# Patient Record
Sex: Male | Born: 1955 | Race: White | Hispanic: No | Marital: Married | Smoking: Current every day smoker
Health system: Southern US, Community
[De-identification: ages and names within clinical notes are randomized; demographics above are authoritative.]

## PROBLEM LIST (undated history)

## (undated) HISTORY — PX: SHOULDER SURGERY: SHX246

## (undated) HISTORY — PX: WRIST SURGERY: SHX841

## (undated) HISTORY — PX: KNEE SURGERY: SHX244

## (undated) HISTORY — PX: BACK SURGERY: SHX140

---

## 1998-08-23 ENCOUNTER — Encounter: Payer: Self-pay | Admitting: Orthopedic Surgery

## 1998-08-23 ENCOUNTER — Ambulatory Visit (HOSPITAL_COMMUNITY): Admission: RE | Admit: 1998-08-23 | Discharge: 1998-08-23 | Payer: Self-pay | Admitting: Orthopedic Surgery

## 1999-02-08 ENCOUNTER — Ambulatory Visit (HOSPITAL_BASED_OUTPATIENT_CLINIC_OR_DEPARTMENT_OTHER): Admission: RE | Admit: 1999-02-08 | Discharge: 1999-02-08 | Payer: Self-pay | Admitting: Orthopedic Surgery

## 1999-10-28 ENCOUNTER — Ambulatory Visit (HOSPITAL_BASED_OUTPATIENT_CLINIC_OR_DEPARTMENT_OTHER): Admission: RE | Admit: 1999-10-28 | Discharge: 1999-10-28 | Payer: Self-pay | Admitting: Orthopedic Surgery

## 2008-10-22 ENCOUNTER — Emergency Department (HOSPITAL_COMMUNITY): Admission: EM | Admit: 2008-10-22 | Discharge: 2008-10-22 | Payer: Self-pay | Admitting: Emergency Medicine

## 2008-11-24 ENCOUNTER — Encounter: Admission: RE | Admit: 2008-11-24 | Discharge: 2008-11-24 | Payer: Self-pay | Admitting: Cardiovascular Disease

## 2008-12-09 ENCOUNTER — Ambulatory Visit (HOSPITAL_COMMUNITY): Admission: RE | Admit: 2008-12-09 | Discharge: 2008-12-09 | Payer: Self-pay | Admitting: Cardiology

## 2010-06-24 LAB — BASIC METABOLIC PANEL
BUN: 12 mg/dL (ref 6–23)
CO2: 27 mEq/L (ref 19–32)
Calcium: 9 mg/dL (ref 8.4–10.5)
Chloride: 105 mEq/L (ref 96–112)
Creatinine, Ser: 1.3 mg/dL (ref 0.4–1.5)
GFR calc Af Amer: >60 mL/min (ref >60)
GFR calc non Af Amer: 57 mL/min — ABNORMAL LOW (ref >60)
Glucose, Bld: 112 mg/dL — ABNORMAL HIGH (ref 70–99)
Potassium: 3.9 mEq/L (ref 3.5–5.1)
Sodium: 138 mEq/L (ref 135–145)

## 2010-06-24 LAB — CBC
HCT: 40.7 % (ref 39.0–52.0)
Hemoglobin: 14.2 g/dL (ref 13.0–17.0)
MCHC: 34.8 g/dL (ref 30.0–36.0)
MCV: 100.2 fL — ABNORMAL HIGH (ref 78.0–100.0)
Platelets: 296 10*3/uL (ref 150–400)
RBC: 4.06 MIL/uL — ABNORMAL LOW (ref 4.22–5.81)
RDW: 14.2 % (ref 11.5–15.5)
WBC: 14.7 10*3/uL — ABNORMAL HIGH (ref 4.0–10.5)

## 2010-06-24 LAB — PROTIME-INR: Prothrombin Time: 19.3 seconds — ABNORMAL HIGH (ref 11.6–15.2)

## 2010-06-26 LAB — CBC
HCT: 40.8 % (ref 39.0–52.0)
Hemoglobin: 14.3 g/dL (ref 13.0–17.0)
MCHC: 35.2 g/dL (ref 30.0–36.0)
MCV: 98.5 fL (ref 78.0–100.0)
Platelets: 278 K/uL (ref 150–400)
RBC: 4.14 MIL/uL — ABNORMAL LOW (ref 4.22–5.81)
RDW: 14.2 % (ref 11.5–15.5)
WBC: 10.3 K/uL (ref 4.0–10.5)

## 2010-06-26 LAB — BASIC METABOLIC PANEL
CO2: 28 mEq/L (ref 19–32)
Calcium: 9.1 mg/dL (ref 8.4–10.5)
Creatinine, Ser: 1.35 mg/dL (ref 0.4–1.5)
Glucose, Bld: 100 mg/dL — ABNORMAL HIGH (ref 70–99)

## 2010-06-26 LAB — DIFFERENTIAL
Basophils Absolute: 0.1 10*3/uL (ref 0.0–0.1)
Basophils Relative: 1 % (ref 0–1)
Eosinophils Absolute: 1 10*3/uL — ABNORMAL HIGH (ref 0.0–0.7)
Eosinophils Relative: 10 % — ABNORMAL HIGH (ref 0–5)
Lymphocytes Relative: 28 % (ref 12–46)
Lymphs Abs: 2.9 K/uL (ref 0.7–4.0)
Monocytes Absolute: 0.6 10*3/uL (ref 0.1–1.0)
Monocytes Relative: 6 % (ref 3–12)
Neutro Abs: 5.8 10*3/uL (ref 1.7–7.7)
Neutrophils Relative %: 56 % (ref 43–77)

## 2010-06-26 LAB — BASIC METABOLIC PANEL WITH GFR
BUN: 7 mg/dL (ref 6–23)
Chloride: 102 meq/L (ref 96–112)
GFR calc Af Amer: >60 mL/min (ref >60)
GFR calc non Af Amer: 55 mL/min — ABNORMAL LOW (ref >60)
Potassium: 3.9 meq/L (ref 3.5–5.1)
Sodium: 137 meq/L (ref 135–145)

## 2010-06-26 LAB — PROTIME-INR
INR: 0.9 (ref 0.00–1.49)
Prothrombin Time: 12.6 s (ref 11.6–15.2)

## 2010-06-26 LAB — APTT: aPTT: 26 seconds (ref 24–37)

## 2010-08-02 IMAGING — US US EXTREM LOW VENOUS*R*
1 series · 14 of 24 positions shown · non-contrast
Comparison: None.

Addendum Begins

Imaging of the greater saphenous vein without thrombosis.
Thrombosis noted within the gastrocnemius vein at the level of the
knee and just below the knee.
Results discussed with Jm  physician's assistant emergency room.
Addendum Ends
CLINICAL DATA: Right calf pain and redness.
RIGHT LOWER EXTREMITY VENOUS DUPLEX ULTRASOUND
TECHNIQUE: Gray-scale sonography with graded compression, as well
as color Doppler and duplex ultrasound, were performed to evaluate
the deep venous system of the right lower extremity from the level
of the common femoral vein through the popliteal and proximal calf
veins. Spectral Doppler was utilized to evaluate flow at rest and
with distal augmentation maneuvers.

[Series 1: unknown · 14 of 36 slices shown]
[im 1/36]
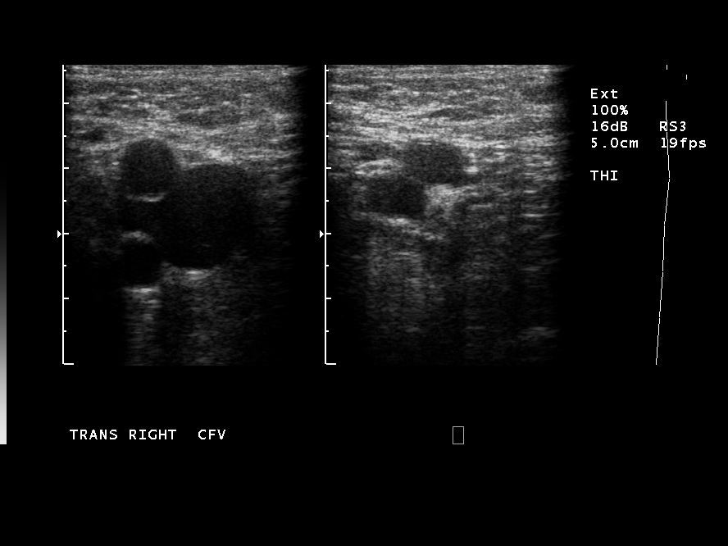
[im 4/36]
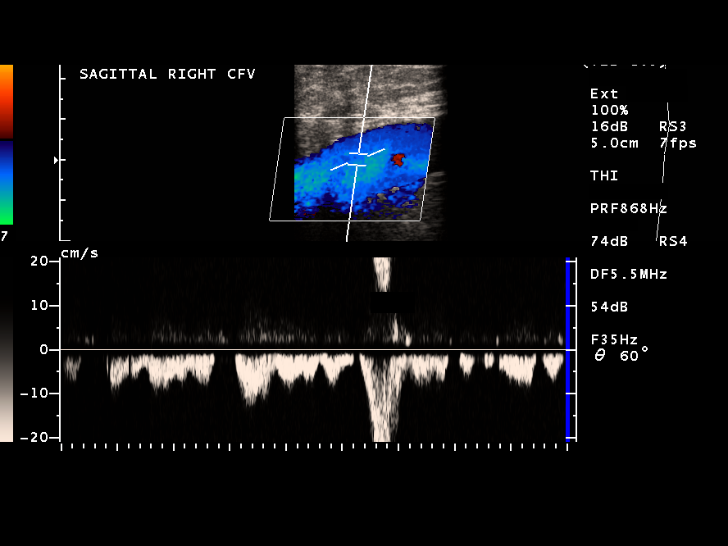
[im 7/36]
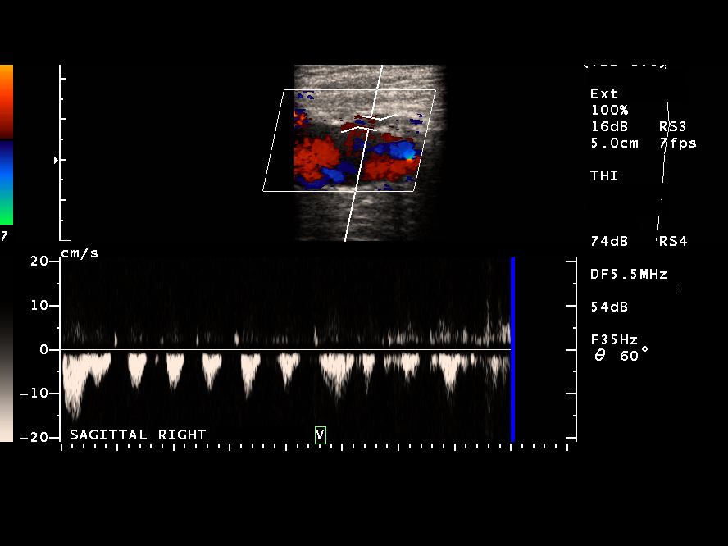
[im 10/36]
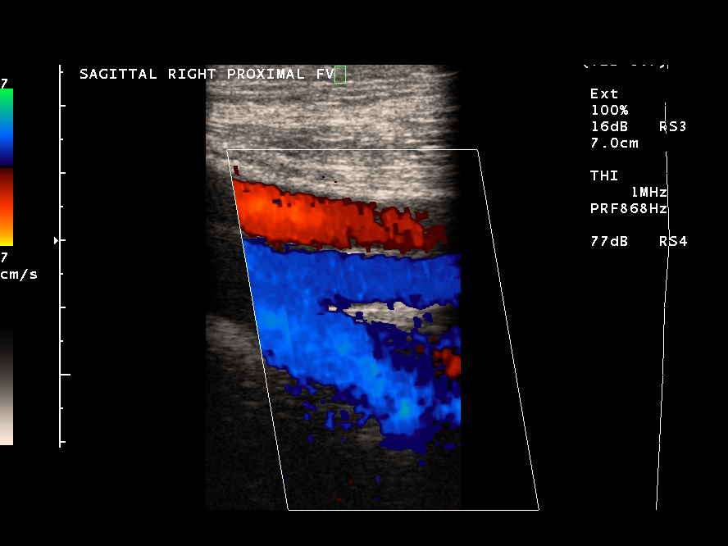
[im 11/36]
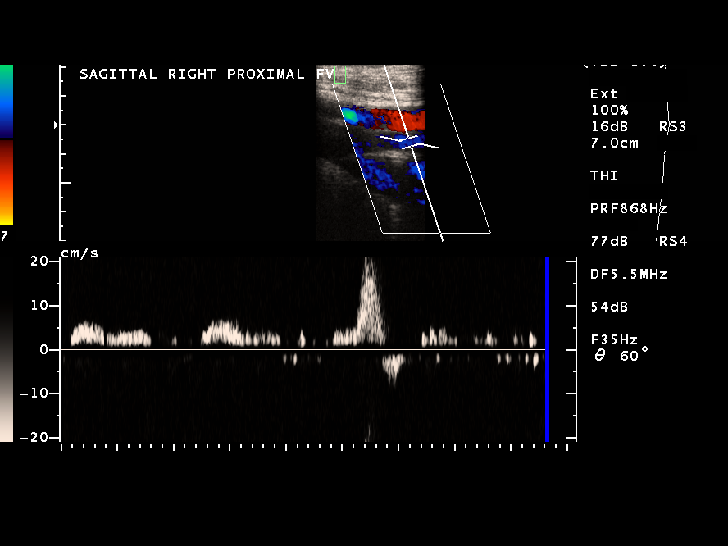
[im 14/36]
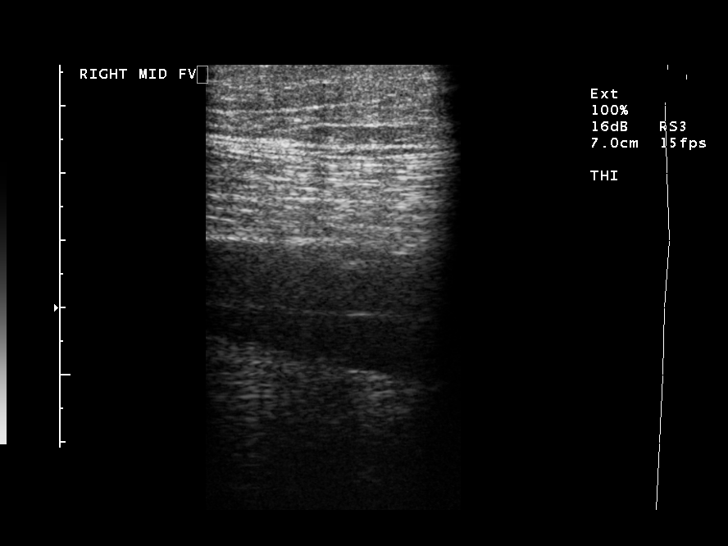
[im 17/36]
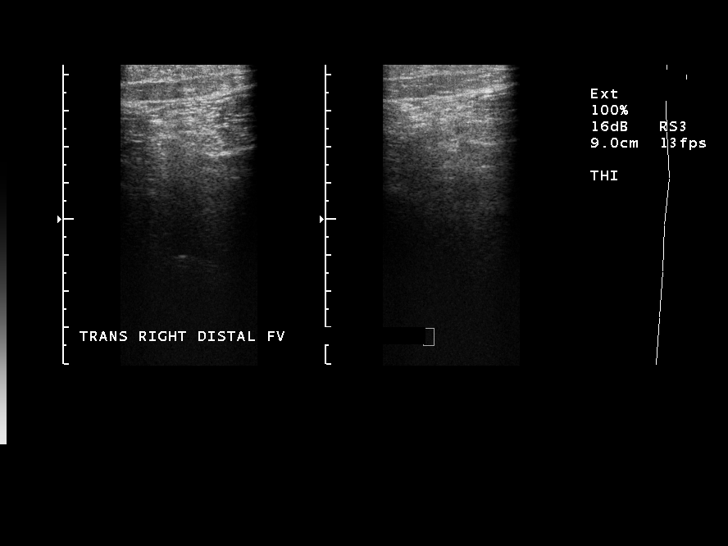
[im 19/36]
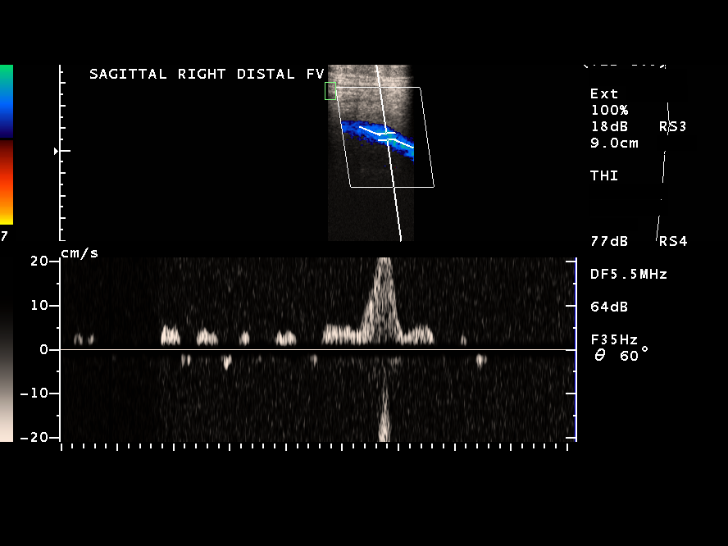
[im 22/36]
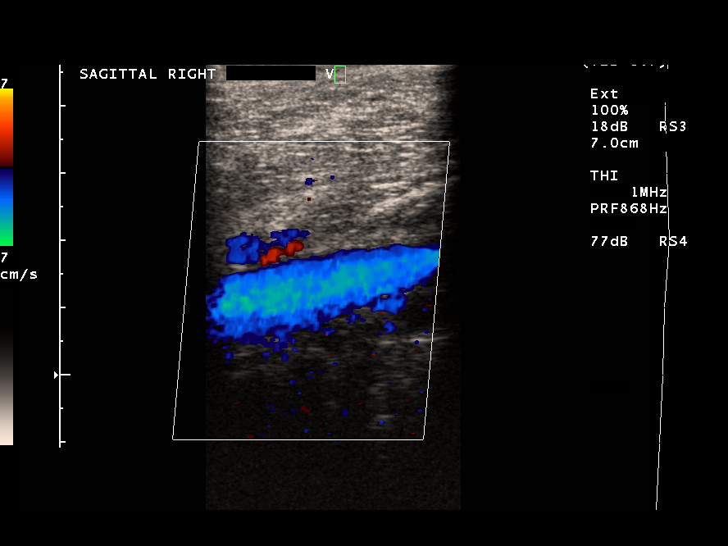
[im 25/36]
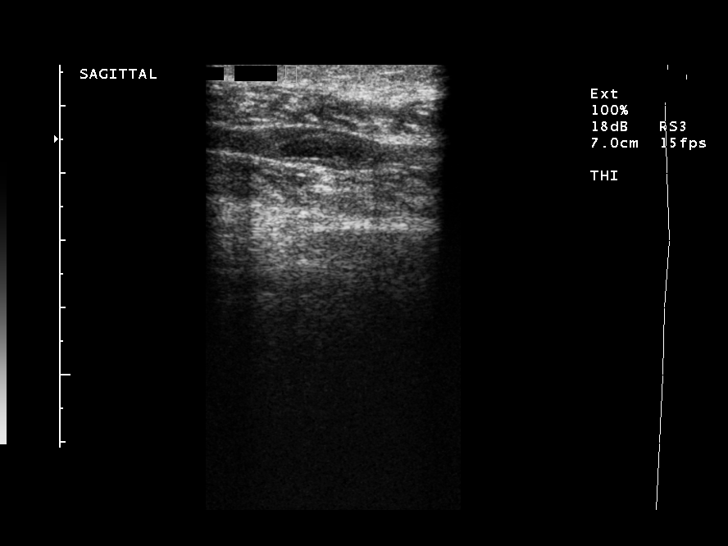
[im 28/36]
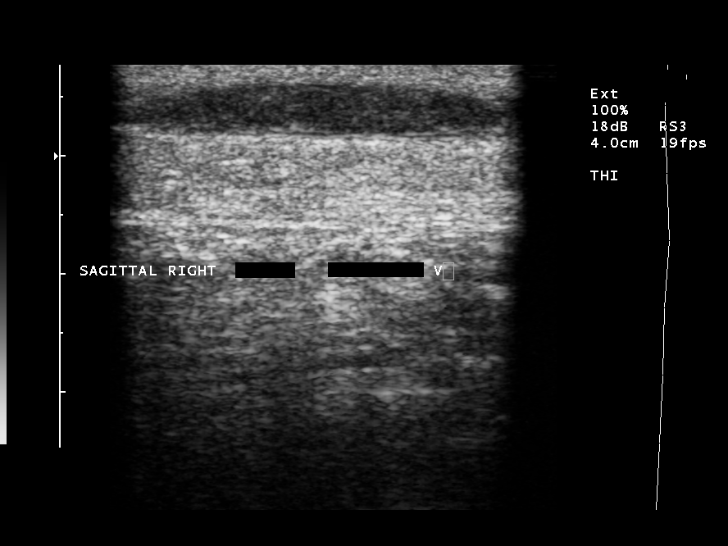
[im 29/36]
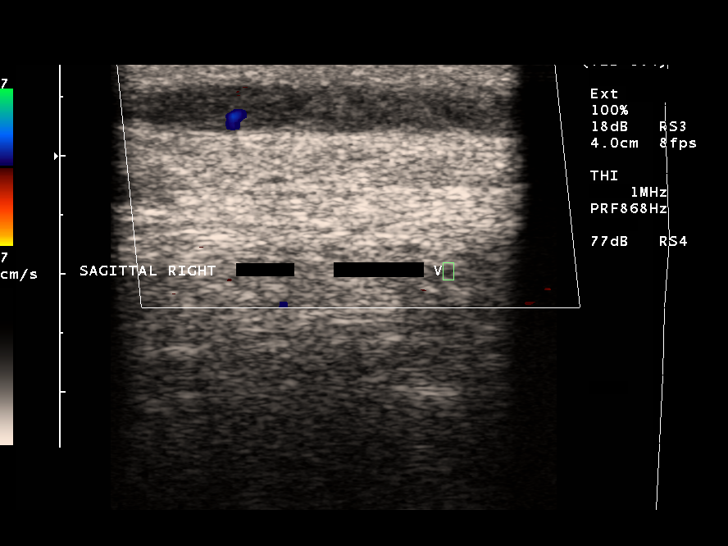
[im 32/36]
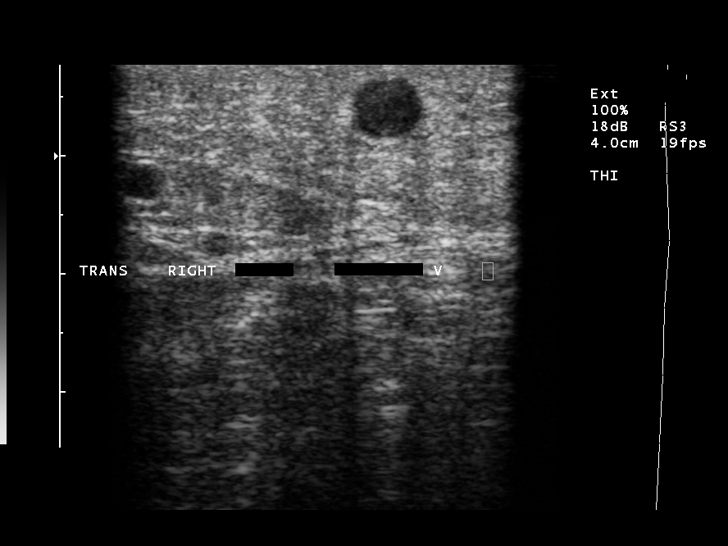
[im 36/36]
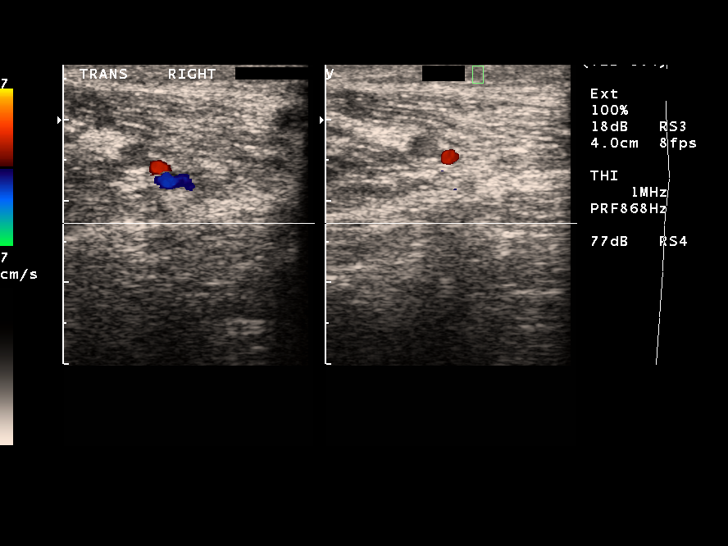

[14 of 24 positions shown; findings below may reference images not displayed]

FINDINGS: Evaluation of the right lower extremity deep veins is
somewhat limited with compression and gray scale. No right-sided
deep venous thrombosis is detected utilizing color flow and
augmentation.  There is however, superficial thrombosis of the
lesser saphenous vein.

IMPRESSION
No right lower extremity deep venous thrombosis is noted on this
slightly limited exam.

Superficial thrombosis of the right lesser saphenous vein.

## 2010-08-05 NOTE — Op Note (Signed)
[place]. [facility]  Patient:    Paul Moody, HUBERTY                   MRN: [id number] Proc. Date: 10/28/99 Adm. Date:  [id number] Disc. Date: [id number] Attending:  Ronne Binning CC:         Nicki Reaper, M.D. x 2   Operative Report  PREOPERATIVE DIAGNOSIS:  Mass, left hand.  POSTOPERATIVE DIAGNOSIS:  Mass, left hand.  PROCEDURE:  Excision of mass, left hand.  SURGEON:  Nicki Reaper, M.D.  ASSISTANT:  None.  ANESTHESIA:  IV regional.  ANESTHESIOLOGIST:  Janetta Hora. Gelene Mink, M.D.  INDICATIONS:  The patient is a 55 year old male with multiple surgeries on his left hand.  He is admitted now with a mass over the dorsal aspect.  DESCRIPTION OF PROCEDURE:  The patient was brought to the operating room where a forearm-based IV regional anesthetic was carried out without difficulty.  He was prepped and draped using Betadine scrub and solution with the left arm free.  An oblique incision was made directly over the mass, carried down through the subcutaneous tissue, neurovascular structures protected.  The mass was immediately encountered.  With blunt and sharp dissection this was dissected free and inadvertantly it broke.  This was filled with a milky fluid.  Cultures were taken.  It was copiously irrigated.  It was well-encapsulated and excised in total and sent to pathology.  The wound was copiously irrigated with saline.  It had no stalk going anywhere else.  It was directly over the extensor tendon in the little finger.  The wound was irrigated.  The skin was closed with interrupted 5-0 nylon suture.  A sterile compressive dressing was applied.  The patient tolerated the procedure well and was taken to the recovery room for observation in satisfactory condition. He is discharged home to return to the [facility] in one week on Talwin NX and Keflex. DD:  10/28/99 TD:  10/29/99 Job: [id number]

## 2010-08-05 NOTE — Op Note (Signed)
[place]. [facility]  Patient:    Paul Moody                  MRN: [id number] Proc. Date: 02/08/99 Adm. Date:  [id number] Attending:  Ronne Binning CC:         Nicki Reaper, M.D.                           Operative Report  PREOPERATIVE DIAGNOSIS:  Status post fusion, carpometacarpal joint, left little  finger.  POSTOPERATIVE DIAGNOSIS:  Status post fusion, carpometacarpal joint, left little finger.  OPERATION:  Removal of screw and bone graft refusion, carpometacarpal joint nonunion, left little finger.  SURGEON:  Nicki Reaper, M.D.  ASSISTANT:  Joaquin Courts, R.N.  ANESTHESIOLOGIST:  Edwin Cap. Zoila Shutter, M.D.  ANESTHESIA:  General.  HISTORY:  The patient is a 55 year old male with an injury to his carpometacarpal joint.  He has undergone two fusions in the past.  He has a screw in place now ith a nonunion and the screw protruding on the volar aspect.  DESCRIPTION OF PROCEDURE:  The patient was brought to the operating room where  general endotracheal intubation anesthesia was carried out without difficulty. He was prepped and draped using Betadine scrub and solution with the left arm free. The limb was exsanguinated with an Esmarch bandage. Tourniquet placed high on the arm was inflated to 250 mmHg.  The old incision was used and carried down through subcutaneous tissues.  Bleeders were electrocauterized.  The dorsal sensory ulnar nerve was identified and protected.  Dissection was carried down to the ulnar aspect of the extensor tendons of the ring and little finger.  A significant amount of scar was present about these, and tenolysis was performed.  The screw head was identified.  This was overgrown by bone.  A bursa was present around the entire  screw.  The bone was removed with rongeurs and osteotome and the screw removed without difficulty.  The nonunion site of the old fusion was then identified with a rongeur.   This was then removed down to the base.  The bursa was removed from around the screw track.  The volar portion of the carpometacarpal area was then  removed with curettes.  This was done under image intensification.  A separate incision was then made over the radial aspect of the wrist, carried down through subcutaneously tissue.  The radial nerve was identified and protected. Dissection was carried down to the first dorsal compartment by brachial radialis muscle tendon, and an incision was made in the periosteum.  A large graft was then removed from the radial aspect of the radial styloid along with a component of cortical  bone.  The cortical bone was shaped.  This was then passed into the screw hole nd placed across the screw hole and the [facility] area.  The cancellous bone was then packed around this, placing this entirely across the area.  X-rays taken in the AP and  lateral direction revealed complete filling of the carpometacarpal area.  A 4-5 K wire was then placed obliquely across the carpometacarpal joint for stability. The wounds were copiously irrigated.  The periosteum was closed with figure-of-eight 4-0 Vicryl, subcutaneous tissue with 4-0 Vicryl, and the skin with interrupted -0 nylon sutures.  A sterile compressive dressing and splint was applied.  The patient tolerated the procedure well and was taken to the recovery room for observation in  satisfactory condition.  He is discharged home to return to the [facility] in one week on Talwin NX and Keflex. DD:  02/08/99 TD:  02/08/99 Job: [id number]

## 2010-09-04 IMAGING — CR DG CHEST 2V
3 series · 3 of 3 positions shown · non-contrast
Comparison: None.

CLINICAL DATA: Pre-procedure respiratory exam.

CHEST - 2 VIEW

[view not recorded (1 of 3)]
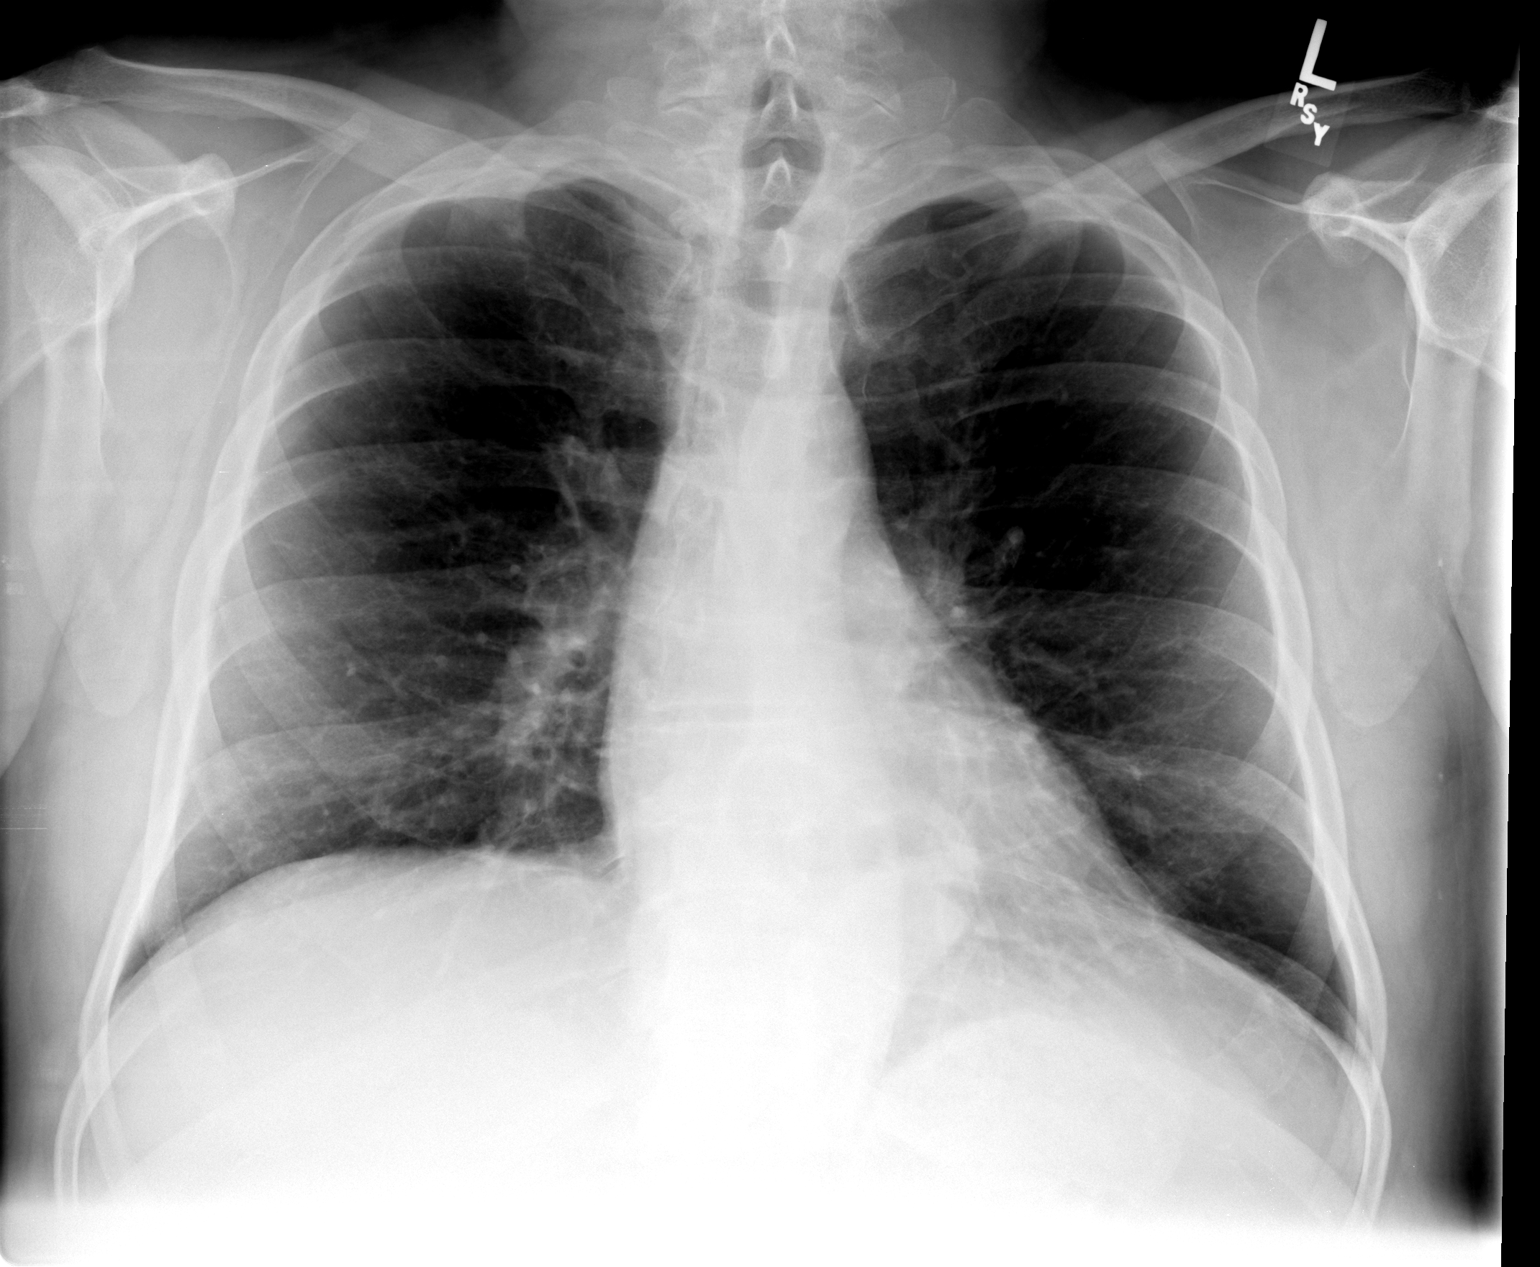

[view not recorded (2 of 3)]
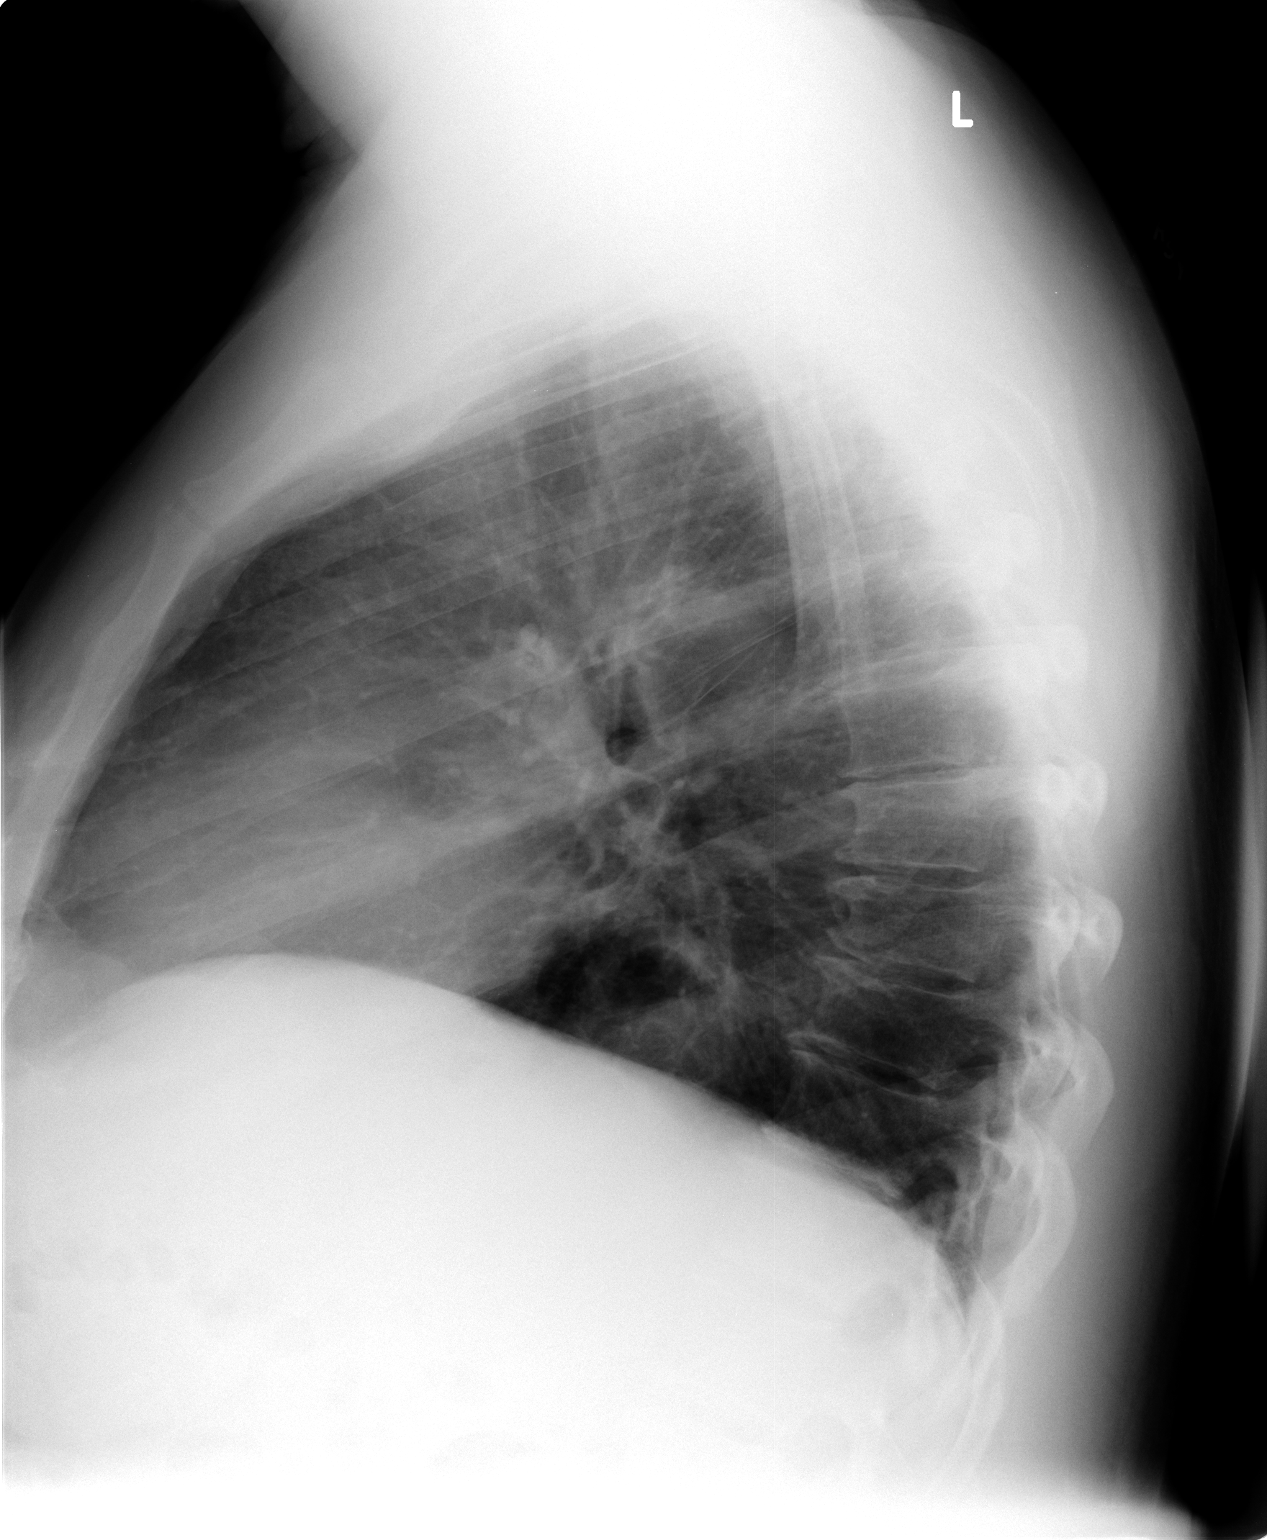

[view not recorded (3 of 3)]
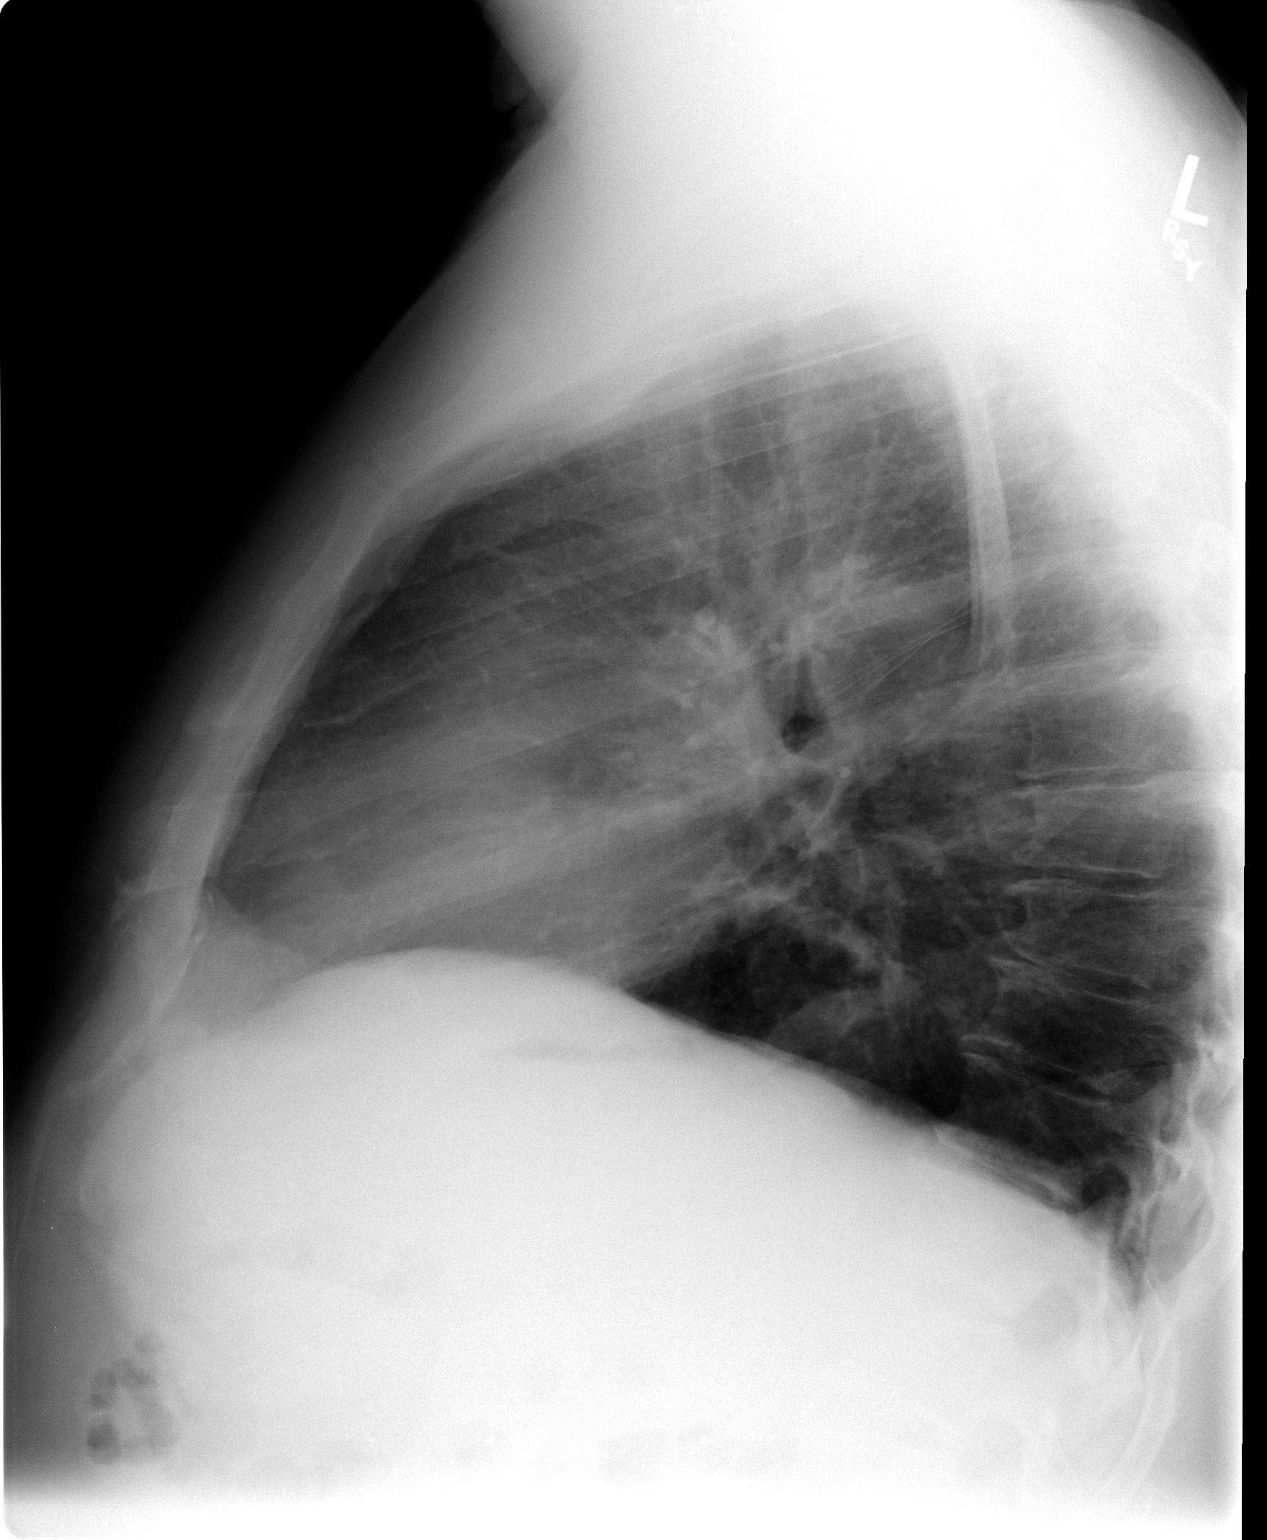

[3 of 3 positions shown; findings below may reference images not displayed]

FINDINGS: Heart size is normal and there is no heart failure.  The
lungs are clear without infiltrate or effusion.  There is a hiatal
hernia present.
IMPRESSION: Hiatal hernia.  No active cardiopulmonary disease.

## 2013-12-22 ENCOUNTER — Encounter (HOSPITAL_COMMUNITY): Payer: Self-pay | Admitting: Emergency Medicine

## 2013-12-22 DIAGNOSIS — Z720 Tobacco use: Secondary | ICD-10-CM | POA: Insufficient documentation

## 2013-12-22 DIAGNOSIS — I808 Phlebitis and thrombophlebitis of other sites: Secondary | ICD-10-CM | POA: Insufficient documentation

## 2013-12-22 DIAGNOSIS — Z792 Long term (current) use of antibiotics: Secondary | ICD-10-CM | POA: Insufficient documentation

## 2013-12-22 DIAGNOSIS — M79601 Pain in right arm: Secondary | ICD-10-CM | POA: Diagnosis present

## 2013-12-22 NOTE — ED Notes (Addendum)
Rt arm has redness present at site of IV put in on Thursday Pt had surgery on lt shoulder  [place]

## 2013-12-23 ENCOUNTER — Emergency Department (HOSPITAL_COMMUNITY)
Admission: EM | Admit: 2013-12-23 | Discharge: 2013-12-23 | Disposition: A | Discharge status: Home / Self Care | Payer: Worker's Compensation | Attending: Emergency Medicine | Admitting: Emergency Medicine

## 2013-12-23 DIAGNOSIS — I809 Phlebitis and thrombophlebitis of unspecified site: Secondary | ICD-10-CM

## 2013-12-23 MED ORDER — CEPHALEXIN 500 MG PO CAPS: 500.0000 mg | ORAL_CAPSULE | Freq: Four times a day (QID) | ORAL | Status: AC

## 2013-12-23 MED ORDER — CEPHALEXIN 500 MG PO CAPS
500.0000 mg | ORAL_CAPSULE | Freq: Once | ORAL | Status: AC
  Administered 2013-12-23: 500 mg via ORAL
  Filled 2013-12-23: qty 1

## 2013-12-23 NOTE — ED Provider Notes (Signed)
CSN: [id number]     Arrival date & time 12/22/13  2054 History  This chart was scribed for Flint Melter, MD by Gwenyth Ober, ED Scribe. This patient was seen in room APA05/APA05 and the patient's care was started at 12:39 AM.   Chief Complaint  Patient presents with  . Arm Pain   Patient is a 58 y.o. male presenting with arm pain. The history is provided by the patient. No language interpreter was used.  Arm Pain   HPI Comments: Paul Moody is a 58 y.o. male who presents to the Emergency Department complaining of soreness of right forearm up to the elbow. Pt had recent surgery in [place] to repair rotation cuff and re-place bicep tendons after work related injury. Pt denies fever, vomiting, and change in appetite as associated symptoms. Pt states shoulder pain is minimal.   History reviewed. No pertinent past medical history. Past Surgical History  Procedure Laterality Date  . Back surgery    . Knee surgery    . Wrist surgery    . Shoulder surgery     History reviewed. No pertinent family history. History  Substance Use Topics  . Smoking status: Current Every Day Smoker  . Smokeless tobacco: Not on file  . Alcohol Use: No    Review of Systems  Constitutional: Negative for fever and appetite change.  Gastrointestinal: Negative for vomiting.  Musculoskeletal: Positive for arthralgias.  All other systems reviewed and are negative.     Allergies  Codeine  Home Medications   Prior to Admission medications   Medication Sig Start Date End Date Taking? Authorizing Provider  cephALEXin (KEFLEX) 500 MG capsule Take 1 capsule (500 mg total) by mouth 4 (four) times daily. 12/23/13   Flint Melter, MD   BP 172/107  Pulse 87  Temp(Src) 98.8 F (37.1 C) (Oral)  Resp 20  Ht 6\' 1"  (1.854 m)  Wt 218 lb (98.884 kg)  BMI 28.77 kg/m2  SpO2 99% Physical Exam  Nursing note and vitals reviewed. Constitutional: He is oriented to person, place, and time. He appears  well-developed and well-nourished.  HENT:  Head: Normocephalic and atraumatic.  Right Ear: External ear normal.  Left Ear: External ear normal.  Eyes: Conjunctivae and EOM are normal. Pupils are equal, round, and reactive to light.  Neck: Normal range of motion and phonation normal. Neck supple.  Cardiovascular: Normal rate, regular rhythm and normal heart sounds.   Pulmonary/Chest: Effort normal and breath sounds normal. He exhibits no bony tenderness.  Abdominal: Soft. There is no tenderness.  Musculoskeletal:  Right volar forearm has tenderness and distribution consistent with a superfical vein with associated erythema. There is no distinct palpable cord. There is no distal edema. Normal cap refill in fingertips. Normal range of motion, right arm. Left shoulder is immobilized, with sling immobilizer  Neurological: He is alert and oriented to person, place, and time. No cranial nerve deficit or sensory deficit. He exhibits normal muscle tone. Coordination normal.  Skin: Skin is warm, dry and intact.  Psychiatric: He has a normal mood and affect. His behavior is normal. Judgment and thought content normal.    ED Course  Procedures (including critical care time) DIAGNOSTIC STUDIES: Oxygen Saturation is 99% on RA, normal by my interpretation.    COORDINATION OF CARE: 12:46 AM Will order Keflex. Discussed treatment plan with pt at bedside and pt agreed to plan.  Labs Review Labs Reviewed - No data to display  Imaging Review No results found.  EKG Interpretation None      MDM   Final diagnoses:  Superficial thrombophlebitis   Evaluation is consistent with superficial thrombophlebitis, related to recent IV cannulation. No evidence for deep venous thrombosis, cellulitis, metabolic instability, or other complications from her recent surgery  Nursing Notes Reviewed/ Care Coordinated Applicable Imaging Reviewed Interpretation of Laboratory Data incorporated into ED  treatment  The patient appears reasonably screened and/or stabilized for discharge and I doubt any other medical condition or other [facility] requiring further screening, evaluation, or treatment in the ED at this time prior to discharge.  Plan: Home Medications- Keflex; Home Treatments- warm compress to affected area; return here if the recommended treatment, does not improve the symptoms; Recommended follow up- PCP of choice when necessary    I personally performed the services described in this documentation, which was scribed in my presence. The recorded information has been reviewed and is accurate.       Flint MelterElliott L Brindley Madarang, MD 12/23/13 [phone]

## 2013-12-23 NOTE — Discharge Instructions (Signed)
Use moist heat on the sore area 3-4 times a day for 30 minutes. Elevate her right arm above her heart, as much as possible. Take an anti-inflammatory, like ibuprofen, 400 mg 3 times a day for one week.   Phlebitis Phlebitis is soreness and swelling (inflammation) of a vein. This can occur in your arms, legs, or torso (trunk), as well as deeper inside your body. Phlebitis is usually not serious when it occurs close to the surface of the body. However, it can cause serious problems when it occurs in a vein deeper inside the body. CAUSES  Phlebitis can be triggered by various things, including:   Reduced blood flow through your veins. This can happen with:  Bed rest over a long period.  Long-distance travel.  Injury.  Surgery.  Being overweight (obese) or pregnant.  Having an IV tube put in the vein and getting certain medicines through the vein.  Cancer and cancer treatment.  Use of illegal drugs taken through the vein.  Inflammatory diseases.  Inherited (genetic) diseases that increase the risk of blood clots.  Hormone therapy, such as birth control pills. SIGNS AND SYMPTOMS   Red, tender, swollen, and painful area on your skin. Usually, the area will be long and narrow.  Firmness along the center of the affected area. This can indicate that a blood clot has formed.  Low-grade fever. DIAGNOSIS  A health care provider can usually diagnose phlebitis by examining the affected area and asking about your symptoms. To check for infection or blood clots, your health care provider may order blood tests or an ultrasound exam of the area. Blood tests and your family history may also indicate if you have an underlying genetic disease that causes blood clots. Occasionally, a piece of tissue is taken from the body (biopsy sample) if an unusual cause of phlebitis is suspected. TREATMENT  Treatment will vary depending on the severity of the condition and the area of the body affected.  Treatment may include:  Use of a warm compress or heating pad.  Use of compression stockings or bandages.  Anti-inflammatory medicines.  Removal of any IV tube that may be causing the problem.  Medicines that kill germs (antibiotics) if an infection is present.  Blood-thinning medicines if a blood clot is suspected or present.  In rare cases, surgery may be needed to remove damaged sections of vein. HOME CARE INSTRUCTIONS   Only take over-the-counter or prescription medicines as directed by your health care provider. Take all medicines exactly as prescribed.  Raise (elevate) the affected area above the level of your heart as directed by your health care provider.  Apply a warm compress or heating pad to the affected area as directed by your health care provider. Do not sleep with the heating pad.  Use compression stockings or bandages as directed. These will speed healing and prevent the condition from coming back.  If you are on blood thinners:  Get follow-up blood tests as directed by your health care provider.  Check with your health care provider before using any new medicines.  Carry a medical alert card or wear your medical alert jewelry to show that you are on blood thinners.  For phlebitis in the legs:  Avoid prolonged standing or bed rest.  Keep your legs moving. Raise your legs when sitting or lying.  Do not smoke.  Women, particularly those over the age of 60, should consider the risks and benefits of taking the contraceptive pill. This kind of hormone  treatment can increase your risk for blood clots.  Follow up with your health care provider as directed. SEEK MEDICAL CARE IF:   You have unusual bruising or any bleeding problems.  Your swelling or pain in the affected area is not improving.  You are on anti-inflammatory medicine, and you develop belly (abdominal) pain. SEEK IMMEDIATE MEDICAL CARE IF:   You have a sudden onset of chest pain or difficulty  breathing.  You have a fever or persistent symptoms for more than 2-3 days.  You have a fever and your symptoms suddenly get worse. MAKE SURE YOU:  Understand these instructions.  Will watch your condition.  Will get help right away if you are not doing well or get worse. Document Released: 02/28/2001 Document Revised: 12/25/2012 Document Reviewed: 11/11/2012 [facility] Patient Information 2015 [place], [place]. This information is not intended to replace advice given to you by your health care provider. Make sure you discuss any questions you have with your health care provider.
